# Patient Record
Sex: Male | Born: 1940 | Race: White | Hispanic: No | Marital: Married | Smoking: Former smoker
Health system: Southern US, Community
[De-identification: ages and names within clinical notes are randomized; demographics above are authoritative.]

## PROBLEM LIST (undated history)

## (undated) DIAGNOSIS — I639 Cerebral infarction, unspecified: Secondary | ICD-10-CM

## (undated) DIAGNOSIS — E785 Hyperlipidemia, unspecified: Secondary | ICD-10-CM

## (undated) DIAGNOSIS — I10 Essential (primary) hypertension: Secondary | ICD-10-CM

## (undated) HISTORY — PX: SHOULDER SURGERY: SHX246

## (undated) HISTORY — DX: Cerebral infarction, unspecified: I63.9

## (undated) HISTORY — DX: Hyperlipidemia, unspecified: E78.5

## (undated) HISTORY — PX: HERNIA REPAIR: SHX51

## (undated) HISTORY — DX: Essential (primary) hypertension: I10

---

## 2005-08-15 ENCOUNTER — Ambulatory Visit: Payer: Self-pay | Admitting: Internal Medicine

## 2006-01-27 ENCOUNTER — Ambulatory Visit: Payer: Self-pay | Admitting: Gastroenterology

## 2006-08-18 ENCOUNTER — Ambulatory Visit: Payer: Self-pay | Admitting: Internal Medicine

## 2006-11-16 ENCOUNTER — Ambulatory Visit: Payer: Self-pay | Admitting: Specialist

## 2006-11-23 ENCOUNTER — Other Ambulatory Visit: Payer: Self-pay

## 2006-11-23 ENCOUNTER — Ambulatory Visit: Payer: Self-pay | Admitting: Specialist

## 2006-11-24 ENCOUNTER — Other Ambulatory Visit: Payer: Self-pay

## 2007-05-03 ENCOUNTER — Ambulatory Visit: Payer: Self-pay | Admitting: Specialist

## 2007-09-02 ENCOUNTER — Ambulatory Visit: Payer: Self-pay | Admitting: Internal Medicine

## 2008-04-04 ENCOUNTER — Ambulatory Visit: Payer: Self-pay | Admitting: Internal Medicine

## 2008-12-11 ENCOUNTER — Emergency Department: Payer: Self-pay | Admitting: Internal Medicine

## 2009-04-16 ENCOUNTER — Other Ambulatory Visit: Payer: Self-pay | Admitting: Internal Medicine

## 2009-04-20 ENCOUNTER — Other Ambulatory Visit: Payer: Self-pay | Admitting: Internal Medicine

## 2010-05-22 ENCOUNTER — Ambulatory Visit: Payer: Self-pay | Admitting: Gastroenterology

## 2010-12-09 ENCOUNTER — Other Ambulatory Visit: Payer: Self-pay | Admitting: Internal Medicine

## 2011-01-20 ENCOUNTER — Other Ambulatory Visit: Payer: Self-pay | Admitting: Internal Medicine

## 2011-02-10 ENCOUNTER — Ambulatory Visit: Payer: Self-pay | Admitting: Internal Medicine

## 2011-04-08 ENCOUNTER — Encounter: Payer: Self-pay | Admitting: Internal Medicine

## 2011-04-17 ENCOUNTER — Emergency Department: Payer: Self-pay | Admitting: Emergency Medicine

## 2011-04-17 LAB — BASIC METABOLIC PANEL
Anion Gap: 14 (ref 7–16)
BUN: 21 mg/dL — ABNORMAL HIGH (ref 7–18)
Calcium, Total: 8.4 mg/dL — ABNORMAL LOW (ref 8.5–10.1)
Chloride: 103 mmol/L (ref 98–107)
Co2: 25 mmol/L (ref 21–32)
Creatinine: 0.99 mg/dL (ref 0.60–1.30)
EGFR (African American): >60
EGFR (Non-African Amer.): >60

## 2011-04-17 LAB — CBC
MCH: 32.4 pg (ref 26.0–34.0)
MCHC: 34 g/dL (ref 32.0–36.0)
Platelet: 216 10*3/uL (ref 150–440)
RBC: 4.45 10*6/uL (ref 4.40–5.90)

## 2011-04-17 LAB — TROPONIN I: Troponin-I: <0.02 ng/mL

## 2011-04-25 ENCOUNTER — Encounter: Payer: Self-pay | Admitting: Internal Medicine

## 2011-07-30 ENCOUNTER — Other Ambulatory Visit: Payer: Self-pay | Admitting: Internal Medicine

## 2011-07-30 LAB — CBC WITH DIFFERENTIAL/PLATELET
Basophil #: 0 10*3/uL (ref 0.0–0.1)
Basophil %: 0.8 %
Eosinophil #: 0.1 10*3/uL (ref 0.0–0.7)
Eosinophil %: 1.3 %
HCT: 45.8 % (ref 40.0–52.0)
HGB: 15.4 g/dL (ref 13.0–18.0)
Lymphocyte #: 1.3 10*3/uL (ref 1.0–3.6)
Lymphocyte %: 24.1 %
MCH: 31.5 pg (ref 26.0–34.0)
MCHC: 33.7 g/dL (ref 32.0–36.0)
MCV: 94 fL (ref 80–100)
Monocyte #: 0.4 x10 3/mm (ref 0.2–1.0)
Monocyte %: 7.6 %
Neutrophil #: 3.5 10*3/uL (ref 1.4–6.5)
Neutrophil %: 66.2 %
Platelet: 204 10*3/uL (ref 150–440)
RBC: 4.89 10*6/uL (ref 4.40–5.90)
RDW: 13.4 % (ref 11.5–14.5)
WBC: 5.4 10*3/uL (ref 3.8–10.6)

## 2011-07-30 LAB — SEDIMENTATION RATE: Erythrocyte Sed Rate: 5 mm/hr (ref 0–20)

## 2012-02-11 ENCOUNTER — Other Ambulatory Visit: Payer: Self-pay | Admitting: Internal Medicine

## 2012-02-11 LAB — LIPID PANEL
Cholesterol: 152 mg/dL (ref 0–200)
HDL Cholesterol: 51 mg/dL (ref 40–60)
Ldl Cholesterol, Calc: 85 mg/dL (ref 0–100)
Triglycerides: 82 mg/dL (ref 0–200)
VLDL Cholesterol, Calc: 16 mg/dL (ref 5–40)

## 2012-02-11 LAB — COMPREHENSIVE METABOLIC PANEL
Albumin: 3.9 g/dL (ref 3.4–5.0)
Alkaline Phosphatase: 53 U/L (ref 50–136)
Anion Gap: 6 — ABNORMAL LOW (ref 7–16)
BUN: 16 mg/dL (ref 7–18)
Bilirubin,Total: 1.1 mg/dL — ABNORMAL HIGH (ref 0.2–1.0)
Calcium, Total: 8.7 mg/dL (ref 8.5–10.1)
Chloride: 106 mmol/L (ref 98–107)
Co2: 28 mmol/L (ref 21–32)
Creatinine: 0.96 mg/dL (ref 0.60–1.30)
EGFR (African American): >60
EGFR (Non-African Amer.): >60
Glucose: 126 mg/dL — ABNORMAL HIGH (ref 65–99)
Osmolality: 282 (ref 275–301)
Potassium: 4 mmol/L (ref 3.5–5.1)
SGOT(AST): 16 U/L (ref 15–37)
SGPT (ALT): 24 U/L (ref 12–78)
Sodium: 140 mmol/L (ref 136–145)
Total Protein: 7.5 g/dL (ref 6.4–8.2)

## 2012-02-11 LAB — CBC WITH DIFFERENTIAL/PLATELET
Basophil #: 0 10*3/uL (ref 0.0–0.1)
Basophil %: 0.6 %
Eosinophil #: 0.1 10*3/uL (ref 0.0–0.7)
Eosinophil %: 2 %
HCT: 45.6 % (ref 40.0–52.0)
HGB: 15.5 g/dL (ref 13.0–18.0)
Lymphocyte #: 1.1 10*3/uL (ref 1.0–3.6)
Lymphocyte %: 20.3 %
MCH: 31.6 pg (ref 26.0–34.0)
MCHC: 34 g/dL (ref 32.0–36.0)
MCV: 93 fL (ref 80–100)
Monocyte #: 0.4 x10 3/mm (ref 0.2–1.0)
Monocyte %: 8.3 %
Neutrophil #: 3.7 10*3/uL (ref 1.4–6.5)
Neutrophil %: 68.8 %
Platelet: 196 10*3/uL (ref 150–440)
RBC: 4.9 10*6/uL (ref 4.40–5.90)
RDW: 13.5 % (ref 11.5–14.5)
WBC: 5.3 10*3/uL (ref 3.8–10.6)

## 2012-02-11 LAB — TSH: Thyroid Stimulating Horm: 2.65 u[IU]/mL

## 2012-02-12 LAB — PSA: PSA: 3.6 ng/mL (ref 0.0–4.0)

## 2012-06-01 ENCOUNTER — Ambulatory Visit: Payer: Self-pay | Admitting: Internal Medicine

## 2012-06-01 LAB — URINALYSIS, COMPLETE
Bilirubin,UR: NEGATIVE
Blood: NEGATIVE
Glucose,UR: NEGATIVE mg/dL (ref 0–75)
Ketone: NEGATIVE
Leukocyte Esterase: NEGATIVE
Nitrite: NEGATIVE
Ph: 5 (ref 4.5–8.0)
Protein: NEGATIVE
RBC,UR: 1 /HPF (ref 0–5)
Specific Gravity: 1.02 (ref 1.003–1.030)
Squamous Epithelial: NONE SEEN
WBC UR: <1 /HPF (ref 0–5)

## 2012-06-01 LAB — LIPID PANEL
Cholesterol: 149 mg/dL (ref 0–200)
HDL Cholesterol: 53 mg/dL (ref 40–60)
Ldl Cholesterol, Calc: 75 mg/dL (ref 0–100)
Triglycerides: 104 mg/dL (ref 0–200)
VLDL Cholesterol, Calc: 21 mg/dL (ref 5–40)

## 2012-06-01 LAB — TSH: Thyroid Stimulating Horm: 2.06 u[IU]/mL

## 2012-06-01 LAB — FOLATE: Folic Acid: 22.1 ng/mL (ref 3.1–100.0)

## 2012-06-01 LAB — HEMOGLOBIN A1C: Hemoglobin A1C: 7.8 % — ABNORMAL HIGH (ref 4.2–6.3)

## 2012-11-29 DIAGNOSIS — E785 Hyperlipidemia, unspecified: Secondary | ICD-10-CM | POA: Insufficient documentation

## 2012-11-29 DIAGNOSIS — R252 Cramp and spasm: Secondary | ICD-10-CM | POA: Insufficient documentation

## 2013-01-17 DIAGNOSIS — E119 Type 2 diabetes mellitus without complications: Secondary | ICD-10-CM | POA: Insufficient documentation

## 2013-01-17 DIAGNOSIS — F09 Unspecified mental disorder due to known physiological condition: Secondary | ICD-10-CM | POA: Insufficient documentation

## 2013-02-07 DIAGNOSIS — R399 Unspecified symptoms and signs involving the genitourinary system: Secondary | ICD-10-CM | POA: Insufficient documentation

## 2013-10-21 DIAGNOSIS — Z8679 Personal history of other diseases of the circulatory system: Secondary | ICD-10-CM | POA: Insufficient documentation

## 2013-10-21 DIAGNOSIS — R079 Chest pain, unspecified: Secondary | ICD-10-CM | POA: Insufficient documentation

## 2014-10-09 DIAGNOSIS — K4090 Unilateral inguinal hernia, without obstruction or gangrene, not specified as recurrent: Secondary | ICD-10-CM | POA: Insufficient documentation

## 2014-10-09 DIAGNOSIS — Z01818 Encounter for other preprocedural examination: Secondary | ICD-10-CM | POA: Insufficient documentation

## 2015-03-09 DIAGNOSIS — I480 Paroxysmal atrial fibrillation: Secondary | ICD-10-CM | POA: Insufficient documentation

## 2015-03-16 ENCOUNTER — Telehealth: Payer: Self-pay | Admitting: Gastroenterology

## 2015-03-16 NOTE — Telephone Encounter (Signed)
Left voice message for patient to call and schedule appointment with Dr. Servando Snare for rectal bleeding. Notes under media

## 2015-03-28 DIAGNOSIS — K922 Gastrointestinal hemorrhage, unspecified: Secondary | ICD-10-CM | POA: Insufficient documentation

## 2015-04-24 ENCOUNTER — Other Ambulatory Visit: Payer: Self-pay

## 2015-04-25 ENCOUNTER — Encounter (INDEPENDENT_AMBULATORY_CARE_PROVIDER_SITE_OTHER): Payer: Self-pay

## 2015-04-25 ENCOUNTER — Ambulatory Visit (INDEPENDENT_AMBULATORY_CARE_PROVIDER_SITE_OTHER): Payer: Medicare Other | Admitting: Gastroenterology

## 2015-04-25 ENCOUNTER — Encounter: Payer: Self-pay | Admitting: Gastroenterology

## 2015-04-25 VITALS — BP 134/75 | HR 60 | Temp 98.4°F | Ht 72.0 in | Wt 197.0 lb

## 2015-04-25 DIAGNOSIS — K921 Melena: Secondary | ICD-10-CM

## 2015-04-25 NOTE — Progress Notes (Signed)
Gastroenterology Consultation  Referring Provider:     No ref. provider found Primary Care Physician:  [facility] Primary Gastroenterologist:  Dr. Servando Snare     Reason for Consultation:     Rectal bleeding        HPI:   Dean Kerr is a 75 y.o. y/o male referred for consultation & management of rectal bleeding by Dr. Kater[facility].  This patient comes today with a history of rectal bleeding 1 approximate 1 month ago. The patient states it happened after he was working in the yard all day and well until the night. The patient states he was blowing leaves in picking up heavy objects. The patient noted that he had rectal bleeding with bright red blood per rectum that day area he has had no further bleeding since then. There is no report of any black stools. He has had 2 colonoscopies in the past that were both negative. One was done in 2007 by me and then he had another one done by another gastrologist back in 2014. He denies any unexplained weight loss or change in bowel habits. There is no report of any abdominal pain.  Past Medical History  Diagnosis Date  . Hyperlipidemia   . Hypertension   . Stroke [facility])     Past Surgical History  Procedure Laterality Date  . Shoulder surgery Right   . Hernia repair      Prior to Admission medications   Medication Sig Start Date End Date Taking? Authorizing Provider  apixaban (ELIQUIS) 5 MG TABS tablet Take by mouth. 03/29/15  Yes Historical Provider, MD  calcium carbonate (OS-CAL - DOSED IN MG OF ELEMENTAL CALCIUM) 1250 (500 Ca) MG tablet Take by mouth.   Yes Historical Provider, MD  Cholecalciferol (VITAMIN D-1000 MAX ST) 1000 units tablet Take by mouth.   Yes Historical Provider, MD  Coenzyme Q10 (CO Q-10) 300 MG CAPS Take by mouth.   Yes Historical Provider, MD  donepezil (ARICEPT) 5 MG tablet Take by mouth. 03/14/15  Yes Historical Provider, MD  fluticasone (FLONASE) 50 MCG/ACT nasal spray Place into the nose. 04/11/15  04/10/16 Yes Historical Provider, MD  magnesium oxide (MAG-OX) 400 MG tablet Take by mouth.   Yes Historical Provider, MD  metFORMIN (GLUCOPHAGE) 1000 MG tablet Take by mouth.   Yes Historical Provider, MD  metoprolol tartrate (LOPRESSOR) 25 MG tablet Take by mouth. 04/11/15  Yes Historical Provider, MD  Multiple Vitamin (MULTIVITAMIN) capsule Take by mouth. 05/06/10  Yes Historical Provider, MD  omeprazole (PRILOSEC) 20 MG capsule Take by mouth. 04/11/15  Yes Historical Provider, MD  traMADol Janean Sark) 50 MG tablet  09/01/14  Yes Historical Provider, MD  atorvastatin (LIPITOR) 40 MG tablet Take by mouth. Reported on 04/25/2015 05/12/14 05/12/15  Historical Provider, MD  clopidogrel (PLAVIX) 75 MG tablet Reported on 04/25/2015 01/27/15   Historical Provider, MD    Family History  Problem Relation Age of Onset  . Heart disease Mother   . Cancer Mother      Social History  Substance Use Topics  . Smoking status: Former Games developer  . Smokeless tobacco: Never Used  . Alcohol Use: 0.0 oz/week    0 Standard drinks or equivalent per week     Comment: rare consumption    Allergies as of 04/25/2015 - Review Complete 04/25/2015  Allergen Reaction Noted  . Oxycodone-acetaminophen Other (See Comments) 04/24/2015  . 2-(ethylmercuriothio)benzoic acid Other (See Comments) 04/24/2015    Review of Systems:  All systems reviewed and negative except where noted in HPI.   Physical Exam:  BP 134/75 mmHg  Pulse 60  Temp(Src) 98.4 F (36.9 C) (Oral)  Ht 6' (1.829 m)  Wt 197 lb (89.359 kg)  BMI 26.71 kg/m2 No LMP for male patient. Psych:  Alert and cooperative. Normal mood and affect. General:   Alert,  Well-developed, well-nourished, pleasant and cooperative in NAD Head:  Normocephalic and atraumatic. Eyes:  Sclera clear, no icterus.   Conjunctiva pink. Ears:  Normal auditory acuity. Nose:  No deformity, discharge, or lesions. Mouth:  No deformity or lesions,oropharynx pink & moist. Neck:  Supple; no  masses or thyromegaly. Lungs:  Respirations even and unlabored.  Clear throughout to auscultation.   No wheezes, crackles, or rhonchi. No acute distress. Heart:  Regular rate and rhythm; no murmurs, clicks, rubs, or gallops. Abdomen:  Normal bowel sounds.  No bruits.  Soft, non-tender and non-distended without masses, hepatosplenomegaly or hernias noted.  No guarding or rebound tenderness.  Negative Carnett sign.   Rectal:  Deferred.  Msk:  Symmetrical without gross deformities.  Good, equal movement & strength bilaterally. Pulses:  Normal pulses noted. Extremities:  No clubbing or edema.  No cyanosis. Neurologic:  Alert and oriented x3;  grossly normal neurologically. Skin:  Intact without significant lesions or rashes.  No jaundice. Lymph Nodes:  No significant cervical adenopathy. Psych:  Alert and cooperative. Normal mood and affect.  Imaging Studies: No results found.  Assessment and Plan:   Dean Kerr is a 75 y.o. y/o male who had 1 episode of rectal bleeding with bright red blood per rectum 1 month ago. The patient states it happened after doing a lot of work in the yard well into the night. He has had no worrisome symptoms such as weight loss abdominal pain and change in bowel habits or family history of colon cancer or colon polyps. The patient has had 2 normal colonoscopies in the past except for diverticulosis. As been given the option of a colonoscopy although he was told that the bleeding is likely hemorrhoidal. Been told that the only way to make sure it was not something more ominous was to do a colonoscopy. Patient would like to hold off on any colonoscopies at this time and see if he develops any further bleeding or any other problems. Has been explained the plan and agrees with it.   Note: This dictation was prepared with Dragon dictation along with smaller phrase technology. Any transcriptional errors that result from this process are unintentional.

## 2015-05-07 ENCOUNTER — Ambulatory Visit: Payer: Self-pay | Admitting: Gastroenterology

## 2015-07-03 ENCOUNTER — Emergency Department
Admission: EM | Admit: 2015-07-03 | Discharge: 2015-07-03 | Disposition: A | Discharge status: Home / Self Care | Payer: Medicare Other | Attending: Emergency Medicine | Admitting: Emergency Medicine

## 2015-07-03 ENCOUNTER — Emergency Department: Payer: Medicare Other

## 2015-07-03 ENCOUNTER — Encounter: Payer: Self-pay | Admitting: Emergency Medicine

## 2015-07-03 DIAGNOSIS — Z8673 Personal history of transient ischemic attack (TIA), and cerebral infarction without residual deficits: Secondary | ICD-10-CM | POA: Insufficient documentation

## 2015-07-03 DIAGNOSIS — I480 Paroxysmal atrial fibrillation: Secondary | ICD-10-CM | POA: Insufficient documentation

## 2015-07-03 DIAGNOSIS — R0689 Other abnormalities of breathing: Secondary | ICD-10-CM

## 2015-07-03 DIAGNOSIS — E785 Hyperlipidemia, unspecified: Secondary | ICD-10-CM | POA: Insufficient documentation

## 2015-07-03 DIAGNOSIS — R0989 Other specified symptoms and signs involving the circulatory and respiratory systems: Secondary | ICD-10-CM | POA: Diagnosis present

## 2015-07-03 DIAGNOSIS — Z79899 Other long term (current) drug therapy: Secondary | ICD-10-CM | POA: Insufficient documentation

## 2015-07-03 DIAGNOSIS — Z791 Long term (current) use of non-steroidal anti-inflammatories (NSAID): Secondary | ICD-10-CM | POA: Diagnosis not present

## 2015-07-03 DIAGNOSIS — I10 Essential (primary) hypertension: Secondary | ICD-10-CM | POA: Insufficient documentation

## 2015-07-03 DIAGNOSIS — Z793 Long term (current) use of hormonal contraceptives: Secondary | ICD-10-CM | POA: Diagnosis not present

## 2015-07-03 DIAGNOSIS — E119 Type 2 diabetes mellitus without complications: Secondary | ICD-10-CM | POA: Insufficient documentation

## 2015-07-03 DIAGNOSIS — Z87891 Personal history of nicotine dependence: Secondary | ICD-10-CM | POA: Insufficient documentation

## 2015-07-03 MED ORDER — BENZONATATE 100 MG PO CAPS: 100.0000 mg | ORAL_CAPSULE | Freq: Four times a day (QID) | ORAL | Status: AC | PRN

## 2015-07-03 MED ORDER — GI COCKTAIL ~~LOC~~
30.0000 mL | Freq: Once | ORAL | Status: AC
  Administered 2015-07-03: 30 mL via ORAL
  Filled 2015-07-03: qty 30

## 2015-07-03 NOTE — Discharge Instructions (Signed)
As we discussed, although you undoubtedly had an aspiration episode, there is no evidence that you have developed a pneumonitis or pneumonia at this time.  Because the GI cocktail worked to relieve her cough and soothe her throat, we have prescribed Tessalon Perles which you can suck on to help numb and soothe the back of her throat and decrease your cough reflex.  You may also use your albuterol inhaler (2-4 puffs as needed for wheezing and cough/bronchospasms every 2-4 hours) as needed.  If you develop any new or worsening symptoms, including but not limited to fever, worsening cough, worsening shortness of breath, etc., please return to the emergency department.

## 2015-07-03 NOTE — ED Notes (Signed)
Patient presents to the ED from Dr. Fredna DowMasoud's office for possible aspiration.  Patient reports coughing and "getting choked" this morning when he was attempting to take his pills.  Patient reports increased coughing and shortness of breath since that time.  Patient states, "It felt like it went down the wrong way."  Patient is alert and oriented and is in no obvious distress at this time.

## 2015-07-03 NOTE — ED Provider Notes (Signed)
[facility] Emergency Department Provider Note  ____________________________________________  Time seen: Approximately 3:08 PM  I have reviewed the triage vital signs and the nursing notes.   HISTORY  Chief Complaint Aspiration    HPI Dean Kerr is a 75 y.o. male who is generally healthy for his age although he does take Eliquis for paroxysmal atrial fibrillation.  He presents by private vehicle referred from his PCP office for evaluation after an aspiration event this morning.  He reports that he was taking his usual handful of pills with some milk when he became choked.  He coughed up all the pills, he believes, but thinks that he must have "choked down" some amountof milk or saliva into his lungs.  This occurred approximately 5 and half hours ago, and he has had a gradually worsening (though initially acute onset) frequent thick cough since that time.  He heals a little bit short of breath at rest but this may be because of the severe cough.  He denies chest pain, abdominal pain, headache.  He reports that when he gets coughing extremely hard he has had a couple of episodes of emesis.  Nothing is making it better and nothing is making it worse.  He is able to tolerate by mouth fluids and has tried drinking some more milk to soothe and coat his throat, and while it will go down just fine, it is not making his symptoms any better.  His voice has not altered and he is not having any difficulty handling his secretions.  He denies fever and chills.  He has no history of ACS and he has no history of lung disease.  He is a former smoker but has not smoked for more than 30 years.   Past Medical History  Diagnosis Date  . Hyperlipidemia   . Hypertension   . Stroke [facility])     Patient Active Problem List   Diagnosis Date Noted  . Bleeding gastrointestinal 03/28/2015  . Paroxysmal atrial fibrillation (HCC) 03/09/2015  . Encounter for other preprocedural  examination 10/09/2014  . Inguinal hernia without obstruction or gangrene 10/09/2014  . Chest pain on exertion 10/21/2013  . History of atrial fibrillation 10/21/2013  . Lower urinary tract symptoms 02/07/2013  . Mild cognitive disorder 01/17/2013  . Controlled type 2 diabetes mellitus without complication (HCC) 01/17/2013  . HLD (hyperlipidemia) 11/29/2012  . Cramps of lower extremity 11/29/2012    Past Surgical History  Procedure Laterality Date  . Shoulder surgery Right   . Hernia repair      Current Outpatient Rx  Name  Route  Sig  Dispense  Refill  . apixaban (ELIQUIS) 5 MG TABS tablet   Oral   Take 5 mg by mouth 2 (two) times daily.          . calcium carbonate (OS-CAL - DOSED IN MG OF ELEMENTAL CALCIUM) 1250 (500 Ca) MG tablet   Oral   Take 1 tablet by mouth daily with breakfast.          . Cholecalciferol (VITAMIN D-1000 MAX ST) 1000 units tablet   Oral   Take 1,000 Units by mouth daily.          . Coenzyme Q10 (CO Q-10) 300 MG CAPS   Oral   Take 1 capsule by mouth daily.          Marland Kitchen donepezil (ARICEPT) 5 MG tablet   Oral   Take 5 mg by mouth at bedtime.          Marland Kitchen  fluticasone (FLONASE) 50 MCG/ACT nasal spray   Each Nare   Place 2 sprays into both nostrils daily.          . magnesium oxide (MAG-OX) 400 MG tablet   Oral   Take 400 mg by mouth 2 (two) times daily.          . metFORMIN (GLUCOPHAGE) 1000 MG tablet   Oral   Take 1,000 mg by mouth 2 (two) times daily with a meal.          . metoprolol tartrate (LOPRESSOR) 25 MG tablet   Oral   Take 12.5 mg by mouth 2 (two) times daily.          . Multiple Vitamin (MULTIVITAMIN) capsule   Oral   Take 1 capsule by mouth daily.          Marland Kitchen omeprazole (PRILOSEC) 20 MG capsule   Oral   Take 20 mg by mouth 2 (two) times daily before a meal.          . traMADol (ULTRAM) 50 MG tablet   Oral   Take 50 mg by mouth 3 (three) times daily as needed.          Marland Kitchen EXPIRED: atorvastatin  (LIPITOR) 40 MG tablet   Oral   Take 40 mg by mouth daily at 6 PM. Reported on 04/25/2015         . benzonatate (TESSALON PERLES) 100 MG capsule   Oral   Take 1 capsule (100 mg total) by mouth every 6 (six) hours as needed for cough.   30 capsule   0     Allergies Oxycodone-acetaminophen and 2-(ethylmercuriothio)benzoic acid  Family History  Problem Relation Age of Onset  . Heart disease Mother   . Cancer Mother     Social History Social History  Substance Use Topics  . Smoking status: Former Games developer  . Smokeless tobacco: Never Used  . Alcohol Use: 0.0 oz/week    0 Standard drinks or equivalent per week     Comment: rare consumption    Review of Systems Constitutional: No fever/chills Eyes: No visual changes. ENT: No sore throat. Cardiovascular: Denies chest pain. Respiratory: Probable aspiration, frequent cough, mild SOB Gastrointestinal: No abdominal pain.  Post-tussive emesis.  No diarrhea.  No constipation. Genitourinary: Negative for dysuria. Musculoskeletal: Negative for back pain. Skin: Negative for rash. Neurological: Negative for headaches, focal weakness or numbness.  10-point ROS otherwise negative.  ____________________________________________   PHYSICAL EXAM:  VITAL SIGNS: ED Triage Vitals  Enc Vitals Group     BP 07/03/15 1452 157/69 mmHg     Pulse Rate 07/03/15 1452 68     Resp 07/03/15 1452 17     Temp 07/03/15 1452 98.4 F (36.9 C)     Temp Source 07/03/15 1452 Oral     SpO2 07/03/15 1452 97 %     Weight 07/03/15 1452 199 lb (90.266 kg)     Height 07/03/15 1452 6' (1.829 m)     Head Cir --      Peak Flow --      Pain Score 07/03/15 1456 5     Pain Loc --      Pain Edu? --      Excl. in GC? --     Constitutional: Alert and oriented. Well appearing and in mild distress due to frequent cough Eyes: Conjunctivae are normal. PERRL. EOMI. Head: Atraumatic. Nose: No congestion/rhinnorhea. Mouth/Throat: Mucous membranes are moist.   Oropharynx non-erythematous. Neck: No stridor.  No meningeal signs.  No cervical lymphadenopathy.  No tenderness to external manipulation of larynx Cardiovascular: Normal rate, regular rhythm. Good peripheral circulation. Grossly normal heart sounds.   Respiratory: Normal respiratory effort.  Frequent thick but non-productive cough.  Mild wheezing throughout lung fields. Gastrointestinal: Soft and nontender. No distention.  Musculoskeletal: No lower extremity tenderness nor edema. No gross deformities of extremities. Neurologic:  Normal speech and language. No gross focal neurologic deficits are appreciated.  Skin:  Skin is warm, dry and intact. No rash noted. Psychiatric: Mood and affect are normal. Speech and behavior are normal.  ____________________________________________   LABS (all labs ordered are listed, but only abnormal results are displayed)  Labs Reviewed - No data to display ____________________________________________  EKG  ED ECG REPORT I, Jamillia Closson, the attending physician, personally viewed and interpreted this ECG.  Date: 07/03/2015 EKG Time: 15:02 Rate: 66 Rhythm: normal sinus rhythm QRS Axis: normal Intervals: normal ST/T Wave abnormalities: normal Conduction Disturbances: none Narrative Interpretation: unremarkable  ____________________________________________  RADIOLOGY   Dg Neck Soft Tissue  07/03/2015  CLINICAL DATA:  Increased cough and shortness of breath after choking on medication this morning. EXAM: NECK SOFT TISSUES - 1+ VIEW COMPARISON:  None. FINDINGS: Normal appearing airway and epiglottis. No radiopaque foreign body. Right carotid artery calcifications. Cervical spine degenerative changes. IMPRESSION: No acute abnormality. Electronically Signed   By: Beckie Salts M.D.   On: 07/03/2015 15:46   Dg Chest 2 View  07/03/2015  CLINICAL DATA:  Increased cough and shortness of breath after choking taking medication this morning EXAM: CHEST  2  VIEW COMPARISON:  None. FINDINGS: Normal sized heart. Clear lungs. Thoracic spine degenerative changes. IMPRESSION: No acute abnormality. Electronically Signed   By: Beckie Salts M.D.   On: 07/03/2015 15:45    ____________________________________________   PROCEDURES  Procedure(s) performed: None  Critical Care performed: No ____________________________________________   INITIAL IMPRESSION / ASSESSMENT AND PLAN / ED COURSE  Pertinent labs & imaging results that were available during my care of the patient were reviewed by me and considered in my medical decision making (see chart for details).  No evidence of acute airway obstruction nor esophageal obstruction.  Suspect developing chemical pneumonitis or simply irritation of his lower airways due to the aspiration event.  Relatively low risk as this was not a highly acidic aspiration.  I am giving a GI cocktail to see if this will code ensued and relieve his cough while we obtained 2 view chest x-ray and soft tissue neck x-rays.  The patient is agreeable to the plan.  ----------------------------------------- 4:42 PM on 07/03/2015 -----------------------------------------  The patient feels better after his GI cocktail.  Radiographs are unremarkable.  I had a lengthy discussion with the patient and family about aspiration events and the possible development of chemical pneumonitis.  At this point there is no evidence he has a pneumonitis and he would not benefit from antibiotics.  Similarly there is no evidence that steroids are helpful in these cases, and at his age I am concerned that they are more likely to have negative side effects.  I explained all this to him as well.  Since the GI cocktail was helpful, I will prescribe him Tessalon Perles and instructed him to suck on them so that they can numb the back of his throat and back of his tongue.  He has an albuterol inhaler at home that he can use to help with the bronchospasm and  wheezing.  I gave them strict return  precautions should he develop a fever, worsening breathing symptoms, etc., which may suggest the development of a secondary pneumonia.  He and his wife understand and agree with this plan.  ____________________________________________  FINAL CLINICAL IMPRESSION(S) / ED DIAGNOSES  Final diagnoses:  Choking episode occurring during daytime     MEDICATIONS GIVEN DURING THIS VISIT:  Medications  gi cocktail (Maalox,Lidocaine,Donnatal) (30 mLs Oral Given 07/03/15 1547)     NEW OUTPATIENT MEDICATIONS STARTED DURING THIS VISIT:  Discharge Medication List as of 07/03/2015  4:48 PM    START taking these medications   Details  benzonatate (TESSALON PERLES) 100 MG capsule Take 1 capsule (100 mg total) by mouth every 6 (six) hours as needed for cough., Starting 07/03/2015, Until Wed 07/02/16, Print          Note:  This document was prepared using Dragon voice recognition software and may include unintentional dictation errors.   Loleta Roseory Gweneth Fredlund, MD 07/04/15 [phone]

## 2017-11-15 IMAGING — CR DG NECK SOFT TISSUE
2 series · 2 of 2 positions shown · non-contrast
Comparison: None.

CLINICAL DATA: Increased cough and shortness of breath after
choking on medication this morning.

EXAM:
NECK SOFT TISSUES - 1+ VIEW

[neck lat]
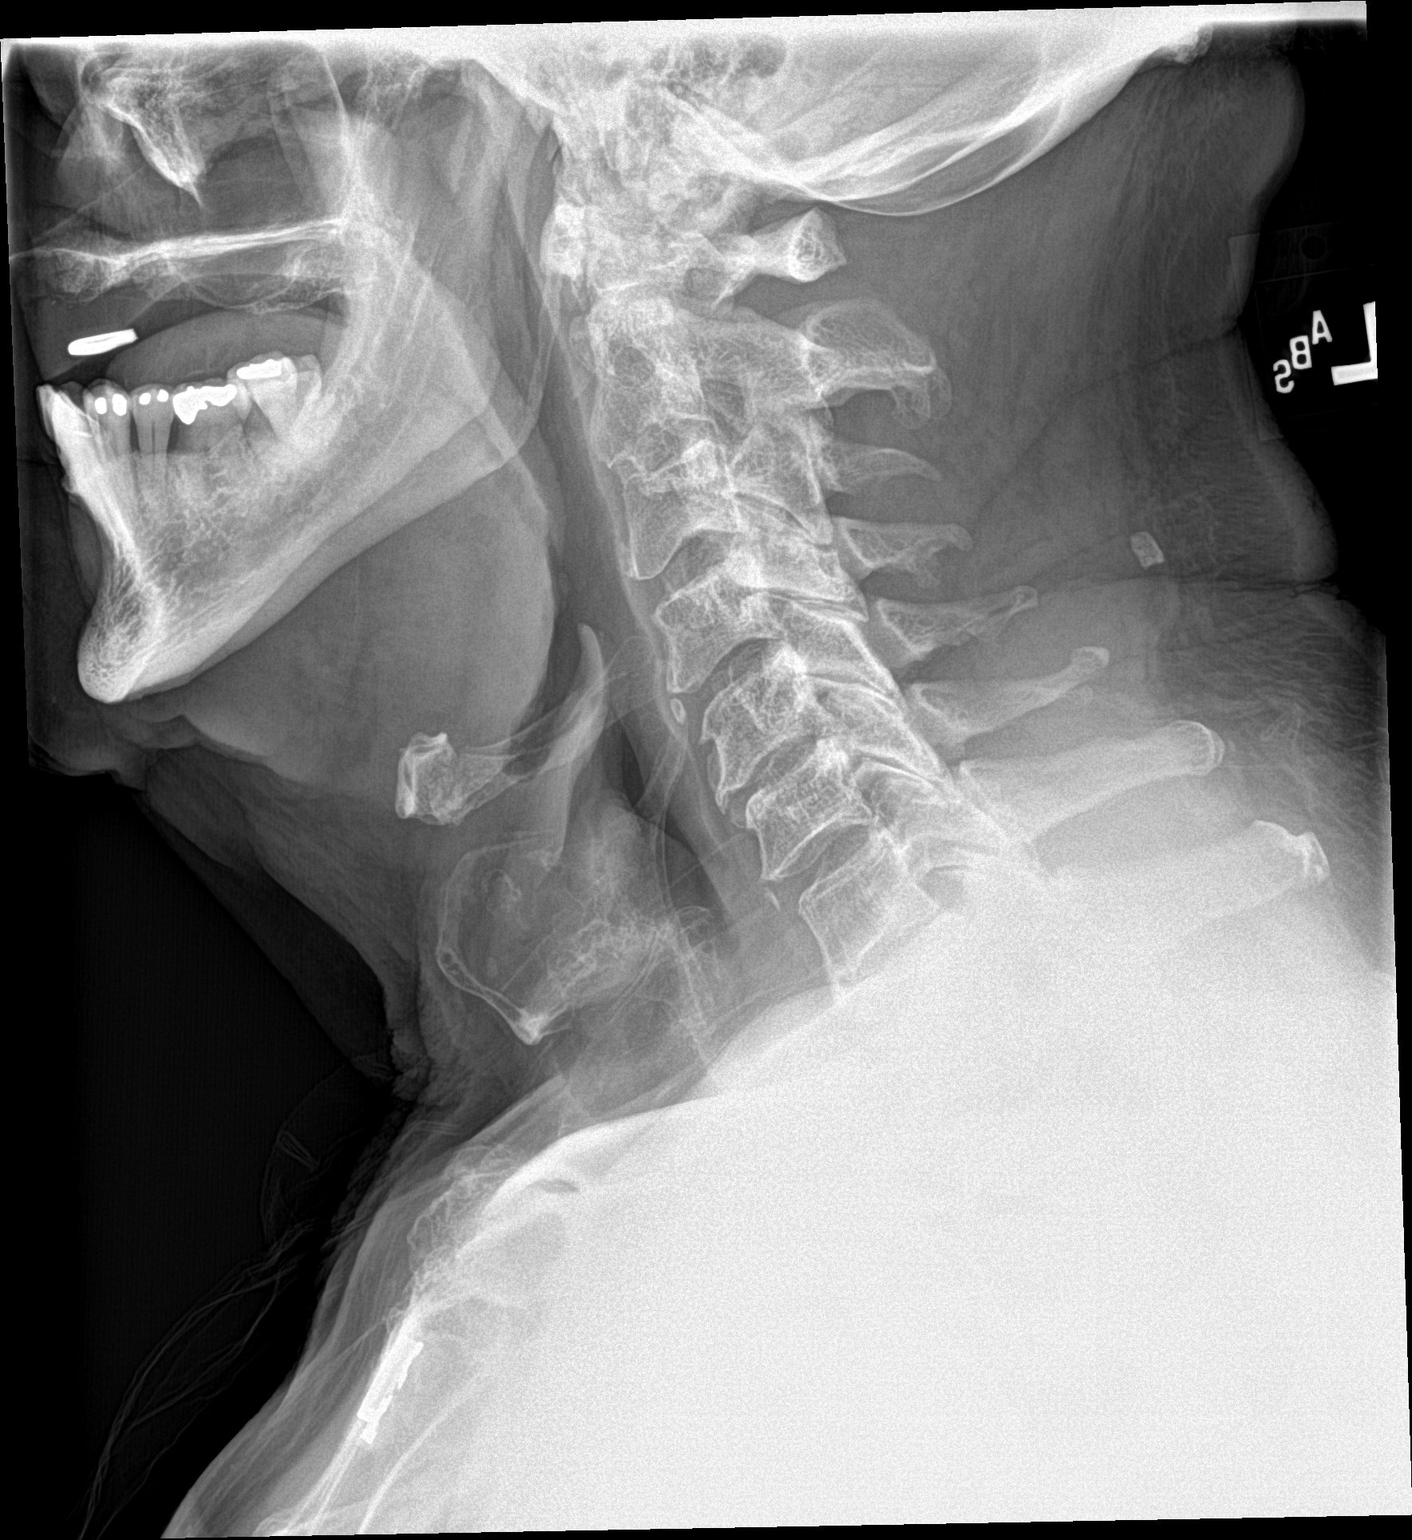

[neck ap]
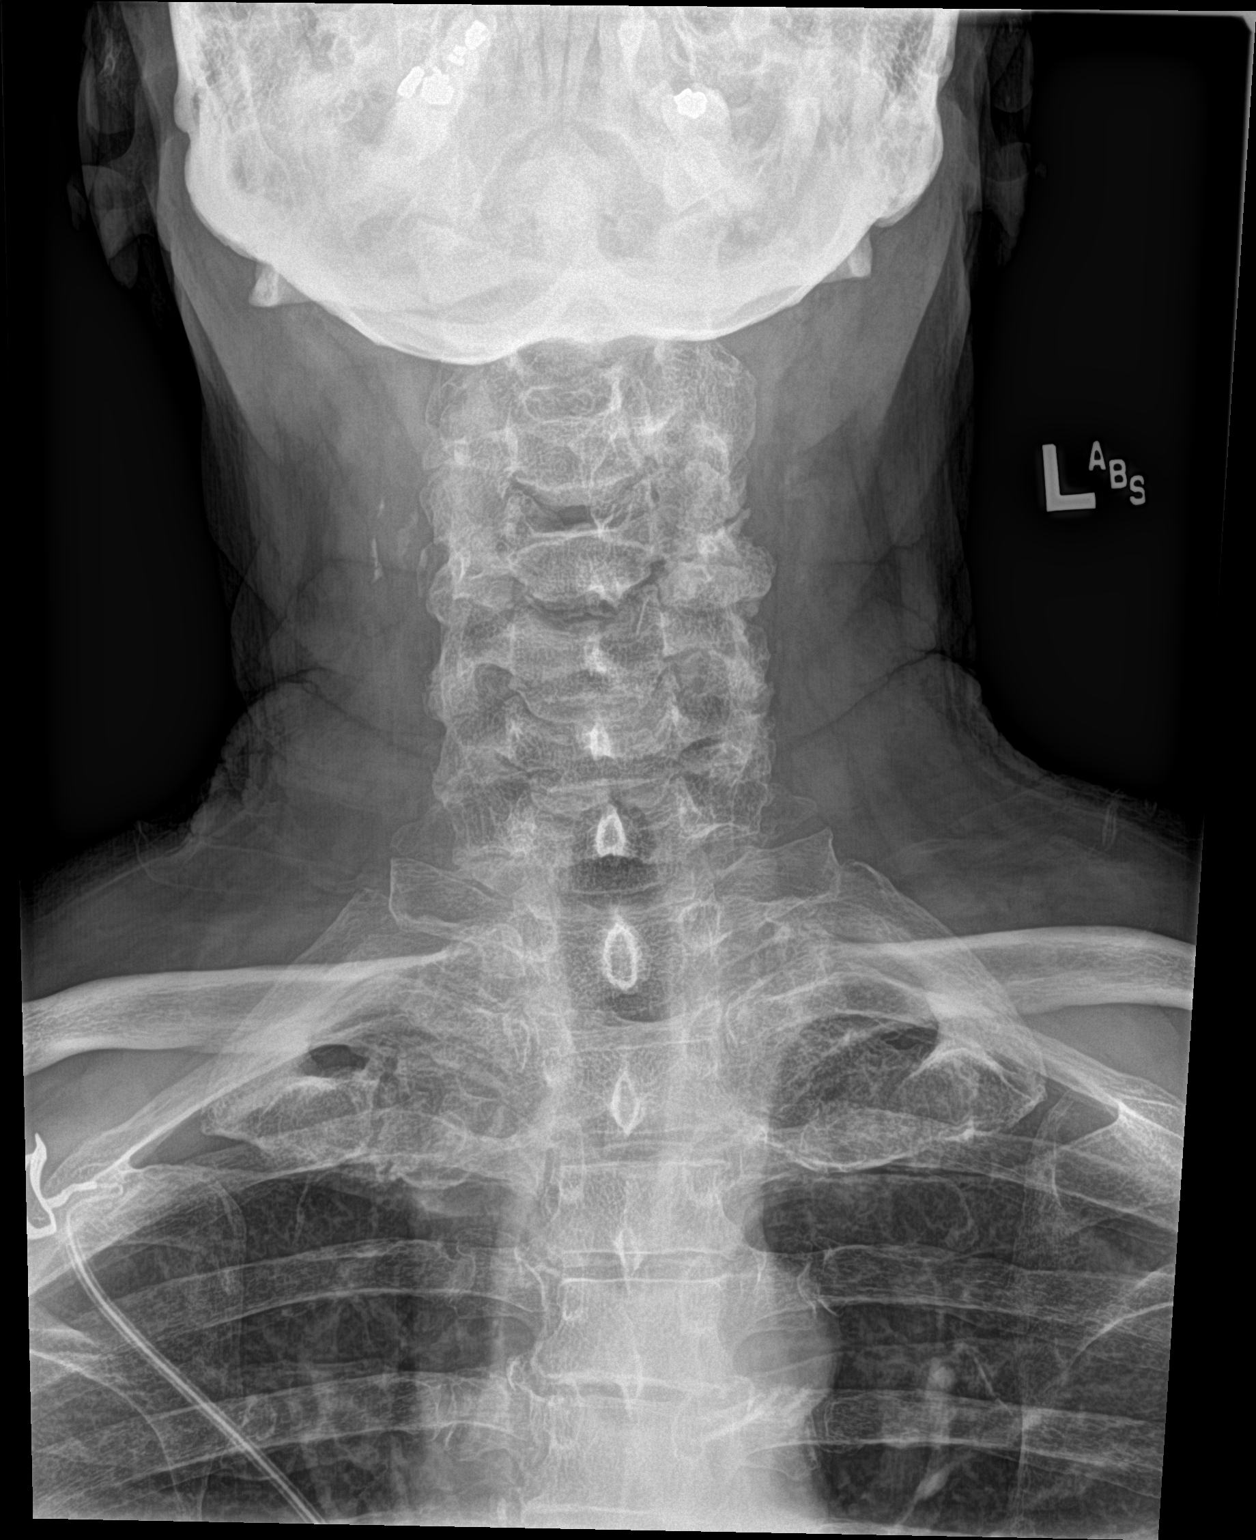

[2 of 2 positions shown; findings below may reference images not displayed]

FINDINGS: Normal appearing airway and epiglottis. No radiopaque foreign body.
Right carotid artery calcifications. Cervical spine degenerative
changes.
IMPRESSION: No acute abnormality.

## 2018-04-06 ENCOUNTER — Other Ambulatory Visit: Payer: Self-pay | Admitting: Otolaryngology

## 2018-04-06 ENCOUNTER — Other Ambulatory Visit (HOSPITAL_COMMUNITY): Payer: Self-pay | Admitting: Otolaryngology

## 2018-04-06 DIAGNOSIS — R42 Dizziness and giddiness: Secondary | ICD-10-CM

## 2018-04-13 ENCOUNTER — Ambulatory Visit: Payer: Medicare Other

## 2020-02-27 ENCOUNTER — Ambulatory Visit
Admission: EM | Admit: 2020-02-27 | Discharge: 2020-02-27 | Disposition: A | Discharge status: Home / Self Care | Payer: Medicare Other | Attending: Sports Medicine | Admitting: Sports Medicine

## 2020-02-27 ENCOUNTER — Other Ambulatory Visit: Payer: Self-pay

## 2020-02-27 DIAGNOSIS — Z20822 Contact with and (suspected) exposure to covid-19: Secondary | ICD-10-CM | POA: Diagnosis not present

## 2020-02-27 DIAGNOSIS — J069 Acute upper respiratory infection, unspecified: Secondary | ICD-10-CM | POA: Diagnosis present

## 2020-02-27 DIAGNOSIS — J209 Acute bronchitis, unspecified: Secondary | ICD-10-CM

## 2020-02-27 DIAGNOSIS — R059 Cough, unspecified: Secondary | ICD-10-CM | POA: Diagnosis present

## 2020-02-27 MED ORDER — ALBUTEROL SULFATE HFA 108 (90 BASE) MCG/ACT IN AERS
1.0000 | INHALATION_SPRAY | Freq: Four times a day (QID) | RESPIRATORY_TRACT | 0 refills | Status: AC | PRN
Start: 2020-02-27 — End: ?

## 2020-02-27 MED ORDER — AZITHROMYCIN 250 MG PO TABS: 250.0000 mg | ORAL_TABLET | Freq: Every day | ORAL | 0 refills | Status: AC

## 2020-02-27 MED ORDER — BENZONATATE 100 MG PO CAPS: 100.0000 mg | ORAL_CAPSULE | Freq: Three times a day (TID) | ORAL | 0 refills | Status: AC

## 2020-02-27 NOTE — ED Triage Notes (Signed)
Pt c/o productive cough with yellow-brown sputum, congestion, runny nose, malaise/fatigue since last Wednesday. Loss of taste past 4 days.  Has been taking mucinex DM, tessalon DM last dose this morning.  Reports family members with similar URI symptoms and have all tested negative for COVID.  Denies SOB, CP, ear pain, sore throat, fever, loss of smell.  S1S2 irregular rhythm, rate varies between 88-97

## 2020-02-27 NOTE — Discharge Instructions (Signed)
Your Covid and influenza tests are are pending.  Someone will call you if they are positive.  Please check my chart if you do not hear from anyone to see if you are negative.  You should quarantine until you have your results.  I sent in a prescription for a Z-Pak as well as an albuterol MDI which she should take as prescribed.  I also sent in a prescription for Tessalon Perles to be used as needed for cough especially at night.  You should continue with the Mucinex 1200 mg twice a day.  I would not take the DM portion of the Mucinex if you are taking cough medicine.  Continue with plenty of fluids, plenty of rest, Tylenol or Motrin for fever discomfort.  If you develop worsening symptoms including chest pain or significant shortness of breath you should call 911 or seek out immediate medical attention at the closest emergency room facility.

## 2020-02-27 NOTE — ED Provider Notes (Signed)
[facility]    CSN: [id number] Arrival date & time: 02/27/20  1855      History   Chief Complaint Chief Complaint  Patient presents with  . Cough    HPI Dean Kerr is a 80 y.o. male.   Patient is a pleasant 80 year old male who presents with his wife for 5 days of cough congestion rhinorrhea.  The cough is productive.  I denies chest pain shortness of breath sore throat or ear pain.  He is a non-smoker and not exposed to secondhand smoke.  No asthma or COPD history.  No abdominal or urinary symptoms.  He has had sick contacts with his grandkids but no Covid exposure.  He has been vaccinated against Covid x2 as well as influenza.  Both the patient and his wife are concerned because he normally does not lay around and is fairly active but for the last 5 days he has been laying in bed a lot with a lot of fatigue and malaise.  No red flag signs or symptoms offered.     Past Medical History:  Diagnosis Date  . Hyperlipidemia   . Hypertension   . Stroke [facility])     Patient Active Problem List   Diagnosis Date Noted  . Bleeding gastrointestinal 03/28/2015  . Paroxysmal atrial fibrillation (HCC) 03/09/2015  . Encounter for other preprocedural examination 10/09/2014  . Inguinal hernia without obstruction or gangrene 10/09/2014  . Chest pain on exertion 10/21/2013  . History of atrial fibrillation 10/21/2013  . Lower urinary tract symptoms 02/07/2013  . Mild cognitive disorder 01/17/2013  . Controlled type 2 diabetes mellitus without complication (HCC) 01/17/2013  . HLD (hyperlipidemia) 11/29/2012  . Cramps of lower extremity 11/29/2012    Past Surgical History:  Procedure Laterality Date  . HERNIA REPAIR    . SHOULDER SURGERY Right        Home Medications    Prior to Admission medications   Medication Sig Start Date End Date Taking? Authorizing Provider  albuterol (VENTOLIN HFA) 108 (90 Base) MCG/ACT inhaler Inhale 1-2 puffs into the lungs every 6 (six)  hours as needed for wheezing or shortness of breath. 02/27/20  Yes Delton See, MD  apixaban (ELIQUIS) 5 MG TABS tablet Take 5 mg by mouth 2 (two) times daily.  03/29/15  Yes [provider]  azithromycin (ZITHROMAX) 250 MG tablet Take 1 tablet (250 mg total) by mouth daily. Take first 2 tablets together, then 1 every day until finished. 02/27/20  Yes Delton See, MD  benzonatate (TESSALON) 100 MG capsule Take 1 capsule (100 mg total) by mouth every 8 (eight) hours. 02/27/20  Yes Delton See, MD  donepezil (ARICEPT) 5 MG tablet Take 5 mg by mouth at bedtime.  03/14/15  Yes [provider]  finasteride (PROSCAR) 5 MG tablet finasteride 5 mg tablet   Yes [provider]  glipiZIDE (GLUCOTROL) 10 MG tablet glipizide 10 mg tablet   Yes [provider]  linagliptin (TRADJENTA) 5 MG TABS tablet Take by mouth. 01/13/20 01/12/21 Yes [provider]  magnesium oxide (MAG-OX) 400 MG tablet Take 400 mg by mouth 2 (two) times daily.    Yes [provider]  metFORMIN (GLUCOPHAGE) 1000 MG tablet Take 1,000 mg by mouth 2 (two) times daily with a meal.    Yes [provider]  metoprolol tartrate (LOPRESSOR) 25 MG tablet Take 12.5 mg by mouth 2 (two) times daily.  04/11/15  Yes [provider]  atorvastatin (LIPITOR) 40 MG  tablet Take 40 mg by mouth daily at 6 PM. Reported on 04/25/2015 05/12/14 05/12/15  [provider]  calcium carbonate (OS-CAL - DOSED IN MG OF ELEMENTAL CALCIUM) 1250 (500 Ca) MG tablet Take 1 tablet by mouth daily with breakfast.     [provider]  Cholecalciferol (VITAMIN D-1000 MAX ST) 1000 units tablet Take 1,000 Units by mouth daily.     [provider]  Coenzyme Q10 (CO Q-10) 300 MG CAPS Take 1 capsule by mouth daily.     [provider]  fluticasone (FLONASE) 50 MCG/ACT nasal spray Place 2 sprays into both nostrils daily.  04/11/15 04/10/16  [provider]  Multiple Vitamin  (MULTIVITAMIN) capsule Take 1 capsule by mouth daily.  05/06/10   [provider]  omeprazole (PRILOSEC) 20 MG capsule Take 20 mg by mouth 2 (two) times daily before a meal.  04/11/15   [provider]  oxybutynin (DITROPAN-XL) 10 MG 24 hr tablet Take 10 mg by mouth daily. 01/12/20   [provider]  traMADol (ULTRAM) 50 MG tablet Take 50 mg by mouth 3 (three) times daily as needed.  09/01/14   [provider]    Family History Family History  Problem Relation Age of Onset  . Heart disease Mother   . Cancer Mother     Social History Social History   Tobacco Use  . Smoking status: Former Games developer  . Smokeless tobacco: Never Used  Substance Use Topics  . Alcohol use: Yes    Alcohol/week: 0.0 standard drinks    Comment: rare consumption  . Drug use: No     Allergies   Oxycodone-acetaminophen, Hydrocodone-acetaminophen, 2-(ethylmercuriothio)benzoic acid, and Other   Review of Systems Review of Systems  Constitutional: Positive for activity change, appetite change and fatigue. Negative for chills, diaphoresis and fever.  HENT: Positive for congestion and rhinorrhea. Negative for ear pain, postnasal drip, sinus pressure, sinus pain and sore throat.   Eyes: Negative for pain.  Respiratory: Positive for cough. Negative for apnea, chest tightness, shortness of breath, wheezing and stridor.   Cardiovascular: Negative for chest pain and palpitations.  Gastrointestinal: Negative for abdominal pain.  Genitourinary: Negative for dysuria.  Skin: Negative for color change, pallor, rash and wound.  Neurological: Negative for dizziness, weakness, light-headedness and headaches.     Physical Exam Triage Vital Signs ED Triage Vitals  Enc Vitals Group     BP 02/27/20 1927 (!) 146/89     Pulse Rate 02/27/20 1927 88     Resp 02/27/20 1927 20     Temp 02/27/20 1927 98.9 F (37.2 C)     Temp Source 02/27/20 1927 Oral     SpO2 02/27/20 1927 98 %     Weight  02/27/20 1922 183 lb (83 kg)     Height 02/27/20 1922 6' (1.829 m)     Head Circumference --      Peak Flow --      Pain Score 02/27/20 1918 0     Pain Loc --      Pain Edu? --      Excl. in GC? --    No data found.  Updated Vital Signs BP (!) 146/89 (BP Location: Left Arm)   Pulse 88   Temp 98.9 F (37.2 C) (Oral)   Resp 20   Ht 6' (1.829 m)   Wt 83 kg   SpO2 98%   BMI 24.82 kg/m   Visual Acuity Right Eye Distance:   Left Eye  Distance:   Bilateral Distance:    Right Eye Near:   Left Eye Near:    Bilateral Near:     Physical Exam Vitals and nursing note reviewed.  Constitutional:      General: He is not in acute distress.    Appearance: Normal appearance. He is not ill-appearing or toxic-appearing.  HENT:     Head: Normocephalic and atraumatic.     Right Ear: Tympanic membrane normal.     Left Ear: Tympanic membrane normal.     Nose: Congestion and rhinorrhea present.     Mouth/Throat:     Mouth: Mucous membranes are moist.     Pharynx: Posterior oropharyngeal erythema present. No oropharyngeal exudate.  Eyes:     Extraocular Movements: Extraocular movements intact.     Conjunctiva/sclera: Conjunctivae normal.     Pupils: Pupils are equal, round, and reactive to light.  Cardiovascular:     Rate and Rhythm: Normal rate and regular rhythm.     Pulses: Normal pulses.     Heart sounds: No murmur heard. No friction rub. No gallop.   Pulmonary:     Effort: Pulmonary effort is normal. No respiratory distress.     Breath sounds: No stridor. Wheezing present. No rhonchi or rales.  Musculoskeletal:     Cervical back: Normal range of motion and neck supple. No rigidity.  Skin:    General: Skin is warm and dry.     Capillary Refill: Capillary refill takes less than 2 seconds.  Neurological:     General: No focal deficit present.     Mental Status: He is alert and oriented to person, place, and time.  Psychiatric:        Mood and Affect: Mood normal.         Behavior: Behavior normal.      UC Treatments / Results  Labs (all labs ordered are listed, but only abnormal results are displayed) Labs Reviewed  SARS CORONAVIRUS 2 (TAT 6-24 HRS)    EKG   Radiology No results found.  Procedures Procedures (including critical care time)  Medications Ordered in UC Medications - No data to display  Initial Impression / Assessment and Plan / UC Course  I have reviewed the triage vital signs and the nursing notes.  Pertinent labs & imaging results that were available during my care of the patient were reviewed by me and considered in my medical decision making (see chart for details).  Clinical impression: 80 year old male with 5 days of cough rhinorrhea productive sputum.  He does have a little bit of a wheeze on examination.  The cough is keeping him awake.  He has fatigue and malaise.  Treatment plan: 1.  The findings and treatment plan were discussed in detail with the patient and his wife.  They were both in agreement. 2.  Given his age as well as the wheeze on exam I am going to go ahead and treat him for atypical pneumonia.  Gave him a Z-Pak, albuterol MDI, and for the cough gave him [employer]. 3.  Recommended Mucinex over-the-counter 1200 mg twice a day just to help thin the secretions. 4.  We will go ahead and tested for Covid.  The rapid tests are not available.  We will send it to the hospital.  He should quarantine until he gets the results.  He is not working outside the home so this should not be an issue.  No work note was given. 5.  Supportive care, over-the-counter meds  as needed, Tylenol or Motrin for fever discomfort.  Plenty of rest and plenty of fluids. 6.  Red flag signs and symptoms discussed in detail with the patient and his wife and when to seek out immediate medical care.  They voiced verbal understanding. 7.  He is welcome to see [place] in the future but for now will discharge from care and to follow-up with [place] as  needed.     Final Clinical Impressions(s) / UC Diagnoses   Final diagnoses:  Cough  Viral upper respiratory tract infection  Acute bronchitis, unspecified organism     Discharge Instructions     Your Covid and influenza tests are are pending.  Someone will call you if they are positive.  Please check my chart if you do not hear from anyone to see if you are negative.  You should quarantine until you have your results.  I sent in a prescription for a Z-Pak as well as an albuterol MDI which she should take as prescribed.  I also sent in a prescription for Tessalon Perles to be used as needed for cough especially at night.  You should continue with the Mucinex 1200 mg twice a day.  I would not take the DM portion of the Mucinex if you are taking cough medicine.  Continue with plenty of fluids, plenty of rest, Tylenol or Motrin for fever discomfort.  If you develop worsening symptoms including chest pain or significant shortness of breath you should call 911 or seek out immediate medical attention at the closest emergency room facility.    ED Prescriptions    Medication Sig Dispense Auth. Provider   azithromycin (ZITHROMAX) 250 MG tablet Take 1 tablet (250 mg total) by mouth daily. Take first 2 tablets together, then 1 every day until finished. 6 tablet Delton See, MD   albuterol (VENTOLIN HFA) 108 (90 Base) MCG/ACT inhaler Inhale 1-2 puffs into the lungs every 6 (six) hours as needed for wheezing or shortness of breath. 1 each Delton See, MD   benzonatate (TESSALON) 100 MG capsule Take 1 capsule (100 mg total) by mouth every 8 (eight) hours. 21 capsule Delton See, MD     PDMP not reviewed this encounter.   Delton See, MD 02/29/20 2253

## 2020-02-28 LAB — SARS CORONAVIRUS 2 (TAT 6-24 HRS): SARS Coronavirus 2: NEGATIVE
